# Patient Record
Sex: Male | Born: 1965 | Race: Black or African American | Hispanic: No | Smoking: Current every day smoker
Health system: Southern US, Community
[De-identification: ages and names within clinical notes are randomized; demographics above are authoritative.]

## PROBLEM LIST (undated history)

## (undated) DIAGNOSIS — K219 Gastro-esophageal reflux disease without esophagitis: Secondary | ICD-10-CM

## (undated) DIAGNOSIS — M1990 Unspecified osteoarthritis, unspecified site: Secondary | ICD-10-CM

## (undated) DIAGNOSIS — L0292 Furuncle, unspecified: Secondary | ICD-10-CM

## (undated) DIAGNOSIS — J45909 Unspecified asthma, uncomplicated: Secondary | ICD-10-CM

## (undated) DIAGNOSIS — F419 Anxiety disorder, unspecified: Secondary | ICD-10-CM

## (undated) HISTORY — PX: NO PAST SURGERIES: SHX2092

---

## 2014-05-07 ENCOUNTER — Emergency Department: Payer: Self-pay | Admitting: Internal Medicine

## 2015-01-15 ENCOUNTER — Encounter (HOSPITAL_COMMUNITY): Payer: Self-pay | Admitting: *Deleted

## 2015-01-15 ENCOUNTER — Observation Stay (HOSPITAL_COMMUNITY)
Admission: AD | Admit: 2015-01-15 | Discharge: 2015-01-17 | Disposition: A | Discharge status: Home / Self Care | Payer: No Typology Code available for payment source | Source: Intra-hospital | Attending: Psychiatry | Admitting: Psychiatry

## 2015-01-15 ENCOUNTER — Emergency Department (HOSPITAL_COMMUNITY)
Admission: EM | Admit: 2015-01-15 | Discharge: 2015-01-15 | Disposition: A | Discharge status: Other Acute Inpatient Hospital | Payer: Self-pay | Attending: Emergency Medicine | Admitting: Emergency Medicine

## 2015-01-15 ENCOUNTER — Encounter (HOSPITAL_COMMUNITY): Payer: Self-pay | Admitting: Emergency Medicine

## 2015-01-15 DIAGNOSIS — F329 Major depressive disorder, single episode, unspecified: Secondary | ICD-10-CM | POA: Insufficient documentation

## 2015-01-15 DIAGNOSIS — F32A Depression, unspecified: Secondary | ICD-10-CM | POA: Diagnosis present

## 2015-01-15 DIAGNOSIS — F1994 Other psychoactive substance use, unspecified with psychoactive substance-induced mood disorder: Secondary | ICD-10-CM | POA: Diagnosis not present

## 2015-01-15 DIAGNOSIS — Z91018 Allergy to other foods: Secondary | ICD-10-CM | POA: Insufficient documentation

## 2015-01-15 DIAGNOSIS — F1020 Alcohol dependence, uncomplicated: Secondary | ICD-10-CM | POA: Insufficient documentation

## 2015-01-15 DIAGNOSIS — F4323 Adjustment disorder with mixed anxiety and depressed mood: Principal | ICD-10-CM | POA: Insufficient documentation

## 2015-01-15 DIAGNOSIS — F322 Major depressive disorder, single episode, severe without psychotic features: Secondary | ICD-10-CM | POA: Diagnosis present

## 2015-01-15 DIAGNOSIS — R45851 Suicidal ideations: Secondary | ICD-10-CM | POA: Diagnosis not present

## 2015-01-15 DIAGNOSIS — Z91041 Radiographic dye allergy status: Secondary | ICD-10-CM | POA: Insufficient documentation

## 2015-01-15 DIAGNOSIS — Z23 Encounter for immunization: Secondary | ICD-10-CM | POA: Insufficient documentation

## 2015-01-15 DIAGNOSIS — Z590 Homelessness: Secondary | ICD-10-CM | POA: Insufficient documentation

## 2015-01-15 DIAGNOSIS — J45909 Unspecified asthma, uncomplicated: Secondary | ICD-10-CM | POA: Insufficient documentation

## 2015-01-15 HISTORY — DX: Unspecified asthma, uncomplicated: J45.909

## 2015-01-15 HISTORY — DX: Anxiety disorder, unspecified: F41.9

## 2015-01-15 LAB — CBC WITH DIFFERENTIAL/PLATELET
BASOS ABS: 0 10*3/uL (ref 0.0–0.1)
BASOS PCT: 0 %
EOS PCT: 1 %
Eosinophils Absolute: 0.1 10*3/uL (ref 0.0–0.7)
HCT: 48.7 % (ref 39.0–52.0)
Hemoglobin: 15.6 g/dL (ref 13.0–17.0)
Lymphocytes Relative: 15 %
Lymphs Abs: 1.1 10*3/uL (ref 0.7–4.0)
MCH: 28 pg (ref 26.0–34.0)
MCHC: 32 g/dL (ref 30.0–36.0)
MCV: 87.4 fL (ref 78.0–100.0)
MONO ABS: 0.7 10*3/uL (ref 0.1–1.0)
MONOS PCT: 9 %
Neutro Abs: 5.7 10*3/uL (ref 1.7–7.7)
Neutrophils Relative %: 75 %
PLATELETS: 280 10*3/uL (ref 150–400)
RBC: 5.57 MIL/uL (ref 4.22–5.81)
RDW: 13.4 % (ref 11.5–15.5)
WBC: 7.6 10*3/uL (ref 4.0–10.5)

## 2015-01-15 LAB — COMPREHENSIVE METABOLIC PANEL
ALK PHOS: 53 U/L (ref 38–126)
ALT: 18 U/L (ref 17–63)
AST: 24 U/L (ref 15–41)
Albumin: 4.1 g/dL (ref 3.5–5.0)
Anion gap: 5 (ref 5–15)
BILIRUBIN TOTAL: 0.9 mg/dL (ref 0.3–1.2)
BUN: 14 mg/dL (ref 6–20)
CALCIUM: 9.2 mg/dL (ref 8.9–10.3)
CO2: 30 mmol/L (ref 22–32)
CREATININE: 1.73 mg/dL — AB (ref 0.61–1.24)
Chloride: 107 mmol/L (ref 101–111)
GFR calc Af Amer: 52 mL/min — ABNORMAL LOW (ref >60)
GFR, EST NON AFRICAN AMERICAN: 45 mL/min — AB (ref >60)
Glucose, Bld: 99 mg/dL (ref 65–99)
Potassium: 4.8 mmol/L (ref 3.5–5.1)
Sodium: 142 mmol/L (ref 135–145)
TOTAL PROTEIN: 6.8 g/dL (ref 6.5–8.1)

## 2015-01-15 LAB — ETHANOL

## 2015-01-15 MED ORDER — HYDROXYZINE HCL 25 MG PO TABS
25.0000 mg | ORAL_TABLET | Freq: Four times a day (QID) | ORAL | Status: DC | PRN
  Administered 2015-01-15: 25 mg via ORAL
  Filled 2015-01-15: qty 1

## 2015-01-15 MED ORDER — ACETAMINOPHEN 500 MG PO TABS
500.0000 mg | ORAL_TABLET | Freq: Four times a day (QID) | ORAL | Status: DC | PRN
  Administered 2015-01-15 – 2015-01-16 (×2): 500 mg via ORAL
  Filled 2015-01-15 (×4): qty 1

## 2015-01-15 MED ORDER — ALUM & MAG HYDROXIDE-SIMETH 200-200-20 MG/5ML PO SUSP
30.0000 mL | ORAL | Status: DC | PRN
Start: 2015-01-15 — End: 2015-01-17

## 2015-01-15 MED ORDER — TRAZODONE HCL 50 MG PO TABS
50.0000 mg | ORAL_TABLET | Freq: Every evening | ORAL | Status: DC | PRN
  Administered 2015-01-15 – 2015-01-16 (×2): 50 mg via ORAL
  Filled 2015-01-15 (×2): qty 1

## 2015-01-15 MED ORDER — MAGNESIUM HYDROXIDE 400 MG/5ML PO SUSP: 30.0000 mL | Freq: Every day | ORAL | Status: DC | PRN

## 2015-01-15 NOTE — ED Notes (Signed)
Per pt, states recent separation, kicked out of house/homeless-states he drank on Friday for the first time in 4 years-states he played russian rulet-not suicidal now-wife wont let him see kids

## 2015-01-15 NOTE — BH Assessment (Addendum)
[facility] Assessment Progress Note  Per Nanine MeansJamison Lord, NP, this pt [facility] at [facility].  Thurman CoyerEric Kaplan, RN, [facility].  Pt has signed Voluntary Admission and Consent for Treatment, as well as Consent to Release Information to his sister, and signed [facility].  Pt's nurse has been notified, and agrees to send original paperwork along with pt via [facility] [place], and to call report to [phone] or [phone].  Doylene Canninghomas Johnston Maddocks, MA Triage Specialist [phone]

## 2015-01-15 NOTE — Progress Notes (Signed)
[facility] INPATIENT:  Family/Significant Other Suicide Prevention Education  Suicide Prevention Education:  Patient Refusal for Family/Significant Other Suicide Prevention Education: The patient Wayne [name] has refused to provide written consent for family/significant other to be provided Family/Significant Other Suicide Prevention Education during admission and/or prior to discharge.  Physician notified.  Wayne [name], Wayne [name] 01/15/2015, 7:03 PM

## 2015-01-15 NOTE — BH Assessment (Addendum)
Assessment Note  Wayne HawthorneJames Ellsworth is an 49 y.o. male who presents to  [facility] voluntarily for tightness in his chest and feeling nauseous. He states that he has an extensive drinking history and has been sober since 2012. He states that he drank a "fifth of scotch "Thursday and put a .44 revolver to his head and played "Guernsey[place]ian Roulette." He states it just "dawned on me what I did and I need help" and decided to come into the ED today. Patient states his recent stressors as; he is "not motivated to get to work" and "can't do it" and he is in a difficult living situation since his wife requested a separation last June. He states that he has been renting a room and "they do drugs and have people in and out and I can't go back there" but states that he has paid his rent through the middle of November and can return, but does not want to return.  He states that his wife "keeps my daughters away from me." He states that he has not been to work since December 20, 2014 but states that he still has a job and can go back to work once he is "ready." He states that he came in today hoping to "talk to somebody and maybe get some medication." he contracts for safety. Patient denies current SI. He states that he "took some pills" at age 49 "because I wanted to see my Dad" who had passed away. He states that he "just threw all the pills up" and did not require hospitalizations. Patient denies other attempts until four days ago when he was intoxicated. Patient denies self-injurious behavior at this time. Patient denies HI and history of being violent towards others. Patient denies pending charges and upcoming court dates. Patient denies AVH and does not appear to be responding to internal stimuli.   Patient is alert and oriented to person, place, time, and situation. Patient sat upright in the chair and was pleasant and cooperative. Patient made fair eye contact and appeared sad.  Patient mood and affect congruent. Patient spoke  logically and coherently.  Patient states that his sleeping habits have decreased and he has been sleeping two to three hours per night and his appetite is poor.  Patient denies previous and current outpatient treatment. Patient states that he had court mandated inpatient for substance abuse in 1994 because he had "drug charges." Patient is able to contract for safety at this time. Patient states that other than drinking four days ago he has not used any alcohol or illicit drugs.    .   Diagnosis: 309. 4 Adjustment disorder, With mixed anxiety and depressed mood   Past Medical History: History reviewed. No pertinent past medical history.  History reviewed. No pertinent past surgical history.  Family History: No family history on file.  Social History:  reports that he has never smoked. He does not have any smokeless tobacco history on file. He reports that he does not drink alcohol or use illicit drugs.  Additional Social History:  Alcohol / Drug Use Pain Medications: See PTA Prescriptions: See PTA Over the Counter: See PTA History of alcohol / drug use?: Yes Longest period of sobriety (when/how long): 4 years Substance #1 Name of Substance 1: Alcohol 1 - Age of First Use: 18 1 - Amount (size/oz): UKN 1 - Frequency: daily 1 - Duration: until four years ago 1 - Last Use / Amount: relapsed four days ago, "about a fifth"  CIWA: CIWA-Ar  BP: 156/76 mmHg Pulse Rate: 84 COWS:    Allergies: No Known Allergies  Home Medications:  (Not in a hospital admission)  OB/GYN Status:  No LMP for male patient.  General Assessment Data Location of Assessment: [facility] TTS Assessment: In system Is this a Tele or Face-to-Face Assessment?: Face-to-Face Is this an Initial Assessment or a Re-assessment for this encounter?: Initial Assessment Marital status: Separated Is patient pregnant?: No Pregnancy Status: No Living Arrangements: Other (Comment) (renting space) Can pt return to current  living arrangement?: Yes (does not want to go back) Admission Status: Voluntary Is patient capable of signing voluntary admission?: Yes Referral Source: Self/Family/Friend Insurance type: BCBS     Crisis Care Plan Living Arrangements: Other (Comment) (renting space) Name of Psychiatrist: None Name of Therapist: None  Education Status Is patient currently in school?: No Highest grade of school patient has completed: Some college  Risk to self with the past 6 months Suicidal Ideation: No Has patient been a risk to self within the past 6 months prior to admission? : No Suicidal Intent: No Has patient had any suicidal intent within the past 6 months prior to admission? : No Is patient at risk for suicide?: No Suicidal Plan?: No Has patient had any suicidal plan within the past 6 months prior to admission? : No Access to Means: No What has been your use of drugs/alcohol within the last 12 months?: Recently relapsed on alcohol Previous Attempts/Gestures: Yes How many times?: 1 (took pills at 13 vomitted) Other Self Harm Risks: Denies Triggers for Past Attempts: Other (Comment) ("wanted to see my daddy" who had passed away) Intentional Self Injurious Behavior: None Family Suicide History: Yes (Aunt when he was younger) Recent stressful life event(s): Conflict (Comment), Divorce, Other (Comment) (divorce pending) Persecutory voices/beliefs?: No Depression: Yes Depression Symptoms: Insomnia, Fatigue, Loss of interest in usual pleasures Substance abuse history and/or treatment for substance abuse?: Yes (1990s) Suicide prevention information given to non-admitted patients: Not applicable  Risk to Others within the past 6 months Homicidal Ideation: No Does patient have any lifetime risk of violence toward others beyond the six months prior to admission? : No Thoughts of Harm to Others: No Current Homicidal Intent: No Current Homicidal Plan: No Access to Homicidal Means:  No Identified Victim: Denies History of harm to others?: No Assessment of Violence: None Noted Violent Behavior Description: Denies Does patient have access to weapons?: No Criminal Charges Pending?: No Does patient have a court date: No Is patient on probation?: No  Psychosis Hallucinations: None noted Delusions: None noted  Mental Status Report Appearance/Hygiene: Unremarkable Eye Contact: Fair Motor Activity: Freedom of movement Speech: Logical/coherent Level of Consciousness: Alert Mood: Sad Affect: Sad Anxiety Level: None Thought Processes: Coherent, Relevant Judgement: Unimpaired Orientation: Person, Place, Time, Situation, Appropriate for developmental age Obsessive Compulsive Thoughts/Behaviors: None  Cognitive Functioning Concentration: Decreased Memory: Recent Intact, Remote Intact IQ: Average Insight: Fair Impulse Control: Fair Appetite: Poor Sleep: Decreased Total Hours of Sleep: 3  ADLScreening [facility] Assessment Services) Patient's cognitive ability adequate to safely complete daily activities?: Yes Patient able to express need for assistance with ADLs?: Yes Independently performs ADLs?: Yes (appropriate for developmental age)  Prior Inpatient Therapy Prior Inpatient Therapy: Yes Prior Therapy Dates: 1994 Prior Therapy Facilty/Provider(s): in [place] Reason for Treatment: Substance Abuse  Prior Outpatient Therapy Prior Outpatient Therapy: No Prior Therapy Dates: N/A Prior Therapy Facilty/Provider(s): N/A Reason for Treatment: N/A Does patient have an ACCT team?: No Does patient have Intensive In-House Services?  :  No Does patient have [facility] services? : No Does patient have P4CC services?: No  ADL Screening (condition at time of admission) Patient's cognitive ability adequate to safely complete daily activities?: Yes Is the patient deaf or have difficulty hearing?: No Does the patient have difficulty seeing, even when wearing glasses/contacts?:  No Does the patient have difficulty concentrating, remembering, or making decisions?: No Patient able to express need for assistance with ADLs?: Yes Does the patient have difficulty dressing or bathing?: No Independently performs ADLs?: Yes (appropriate for developmental age) Does the patient have difficulty walking or climbing stairs?: No Weakness of Legs: None Weakness of Arms/Hands: None  Home Assistive Devices/Equipment Home Assistive Devices/Equipment: None  Therapy Consults (therapy consults require a physician order) PT Evaluation Needed: No OT Evalulation Needed: No SLP Evaluation Needed: No Abuse/Neglect Assessment (Assessment to be complete while patient is alone) Physical Abuse: Denies Verbal Abuse: Denies Sexual Abuse: Denies Exploitation of patient/patient's resources: Denies Self-Neglect: Denies Values / Beliefs Cultural Requests During Hospitalization: None Spiritual Requests During Hospitalization: None Consults Spiritual Care Consult Needed: No Social Work Consult Needed: No Merchant navy officer (For Healthcare) Does patient have an advance directive?: No Would patient like information on creating an advanced directive?: No - patient declined information    Additional Information 1:1 In Past 12 Months?: No CIRT Risk: No Elopement Risk: No Does patient have medical clearance?: No     Disposition:  Disposition Initial Assessment Completed for this Encounter: Yes Disposition of Patient: Other dispositions Other disposition(s): Other (Comment) (observation unit)  On Site Evaluation by:   Reviewed with Physician:    Lamere Lightner 01/15/2015 1:23 PM

## 2015-01-15 NOTE — ED Provider Notes (Signed)
CSN: [id number]     Arrival date & time 01/15/15  1146 History   First MD Initiated Contact with Patient 01/15/15 1258     Chief Complaint  Patient presents with  . Depression     (Consider location/radiation/quality/duration/timing/severity/associated sxs/prior Treatment) HPI Comments: Pt here with increased depression for several days caused by recent divorce--pt states that he is homeless and has suicidal ideations and played 'russian roulette' with a gun a few days ago-- Denies any prior h/o of si/hi Denies prior h/o suicide attempt or psychaitric hx- Did recently start to use etoh but denies illicit drug use  No current si but notes severe depression  Patient is a 49 y.o. male presenting with depression. The history is provided by the patient.  Depression    History reviewed. No pertinent past medical history. History reviewed. No pertinent past surgical history. No family history on file. Social History  Substance Use Topics  . Smoking status: Never Smoker   . Smokeless tobacco: None  . Alcohol Use: No    Review of Systems  Psychiatric/Behavioral: Positive for depression.  All other systems reviewed and are negative.     Allergies  Review of patient's allergies indicates no known allergies.  Home Medications   Prior to Admission medications   Medication Sig Start Date End Date Taking? Authorizing Provider  acetaminophen (TYLENOL) 500 MG tablet Take 500 mg by mouth every 6 (six) hours as needed for mild pain, moderate pain, fever or headache.   Yes Historical Provider, MD  influenza vac recombinant HA trivalent (FLUBLOK) injection Inject 0.5 mLs into the muscle once.   Yes Historical Provider, MD  pneumococcal 23 valent vaccine (PNU-IMMUNE) 25 MCG/0.5ML injection Inject 0.5 mLs into the muscle once.   Yes Historical Provider, MD  Tdap (BOOSTRIX) 5-2.5-18.5 LF-MCG/0.5 injection Inject 0.5 mLs into the muscle once.   Yes Historical Provider, MD   BP 156/76 mmHg   Pulse 84  Temp(Src) 98.1 F (36.7 C) (Oral)  Resp 24  SpO2 99% Physical Exam  Constitutional: He is oriented to person, place, and time. He appears well-developed and well-nourished.  Non-toxic appearance. No distress.  HENT:  Head: Normocephalic and atraumatic.  Eyes: Conjunctivae, EOM and lids are normal. Pupils are equal, round, and reactive to light.  Neck: Normal range of motion. Neck supple. No tracheal deviation present. No thyroid mass present.  Cardiovascular: Normal rate, regular rhythm and normal heart sounds.  Exam reveals no gallop.   No murmur heard. Pulmonary/Chest: Effort normal and breath sounds normal. No stridor. No respiratory distress. He has no decreased breath sounds. He has no wheezes. He has no rhonchi. He has no rales.  Abdominal: Soft. Normal appearance and bowel sounds are normal. He exhibits no distension. There is no tenderness. There is no rebound and no CVA tenderness.  Musculoskeletal: Normal range of motion. He exhibits no edema or tenderness.  Neurological: He is alert and oriented to person, place, and time. He has normal strength. No cranial nerve deficit or sensory deficit. GCS eye subscore is 4. GCS verbal subscore is 5. GCS motor subscore is 6.  Skin: Skin is warm and dry. No abrasion and no rash noted.  Psychiatric: His affect is blunt. His speech is delayed. He is withdrawn. He expresses no suicidal ideation. He expresses no suicidal plans.  Nursing note and vitals reviewed.   ED Course  Procedures (including critical care time) Labs Review Labs Reviewed  COMPREHENSIVE METABOLIC PANEL  ETHANOL  CBC WITH DIFFERENTIAL/PLATELET  URINE RAPID  DRUG SCREEN, HOSP PERFORMED    Imaging Review No results found. I have personally reviewed and evaluated these images and lab results as part of my medical decision-making.   EKG Interpretation None      MDM   Final diagnoses:  None    Pt has been seen by [facility] and accepted for obs, labs pending  and dr. Verdie Mosherliu to f/u    Lorre NickAnthony Taziyah Iannuzzi, MD 01/15/15 1317

## 2015-01-15 NOTE — H&P (Signed)
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 Depression [F32.9] 01/15/2015  . MDD (major depressive disorder), single episode, severe ([place]) [F32.2] 01/15/2015   History of Present Illness: Wayne [name] presents from the [facility] ED due to initially presenting with CP and nausea, but later endorsed going on a drinking binge while having increasing suicidal ideations and reported suicidal attempt by putting a 44 revolver to his head. The patient is endorsing multiple triggers to include recent separation from his wife, inability to see his kids and drinking alcohol again after being C/D and sober x 4 years. Wayne [name] is denying any suicidal thoughts, homicidal ideations or self harm at this present time. Patient is endorsing ruminating on issues, racing thoughts and worrying. He is endorsing a hx of GAD and panic attacks, last episode a week ago. He is denying any AVH, paranoia or delusional thoughts.  Associated Signs/Symptoms: Depression Symptoms:  depressed mood, feelings of worthlessness/guilt, hopelessness, suicidal thoughts with specific plan, suicidal attempt, (Hypo) Manic Symptoms:  non applicable Anxiety Symptoms:  Panic Symptoms, Psychotic Symptoms:  not applicable PTSD Symptoms: NA Total Time spent with patient: 30 minutes  Past Psychiatric History: Alcoholism, Substance abuse  Risk to Self:  no Risk to Others:  no Prior Inpatient Therapy:  no Prior Outpatient Therapy:  while incarcerated  Alcohol Screening:   Substance Abuse History in the last 12 months:  Yes.   Consequences of Substance Abuse: Family Consequences:  estranged, seperated from wife Previous Psychotropic Medications:  no Psychological Evaluations: No  Past Medical History:  Past Medical History  Diagnosis Date  . Asthma   . Anxiety    History reviewed. No pertinent past surgical history. Family History: History reviewed. No pertinent family history. Family Psychiatric  History: denies Social History:  History  Alcohol Use No     History  Drug Use No    Social History   Social History  . Marital Status: Married    Spouse Name: N/A  . Number of Children: N/A  . Years of Education: N/A   Social History Main Topics  . Smoking status: Never Smoker   . Smokeless tobacco: Never Used  . Alcohol Use: No  . Drug Use: No  . Sexual Activity: Yes   Other Topics Concern  . None   Social History Narrative   Additional Social History:    Pain Medications: See PTA Prescriptions: See PTA Over the Counter: See PTA History of alcohol / drug use?: Yes (prior to last week, last time drank was 2012) Longest period of sobriety (when/how long): 4 years Name of Substance 1: Alcohol 1 - Age of First Use: 18 1 - Amount (size/oz): UKN 1 - Frequency: daily 1 - Duration: until four years ago 1 - Last Use / Amount: relapsed four years ago, "about a fifth"                  Allergies:   Allergies  Allergen Reactions  . Iodine Anaphylaxis    Swelling and rash  . Coconut Flavor Rash    rash   Lab Results:  Results for orders placed or performed during the hospital encounter of 01/15/15 (from the past 48 hour(s))  Comprehensive metabolic panel     Status: Abnormal   Collection  Time: 01/15/15  1:17 PM  Result Value Ref Range   Sodium 142 135 - 145 mmol/L   Potassium 4.8 3.5 - 5.1 mmol/L   Chloride 107 101 - 111 mmol/L   CO2 30 22 - 32 mmol/L   Glucose, Bld 99 65 - 99 mg/dL   BUN 14 6 - 20 mg/dL   Creatinine, Ser 1.73 (H) 0.61 - 1.24 mg/dL   Calcium 9.2 8.9 - 10.3 mg/dL   Total Protein 6.8 6.5 - 8.1 g/dL   Albumin 4.1 3.5 - 5.0 g/dL   AST 24 15 - 41 U/L   ALT 18 17 - 63 U/L   Alkaline  Phosphatase 53 38 - 126 U/L   Total Bilirubin 0.9 0.3 - 1.2 mg/dL   GFR calc non Af Amer 45 (L) >60 mL/min   GFR calc Af Amer 52 (L) >60 mL/min    Comment: (NOTE) The eGFR has been calculated using the CKD EPI equation. This calculation has not been validated in all clinical situations. eGFR's persistently <60 mL/min signify possible Chronic Kidney Disease.    Anion gap 5 5 - 15  Ethanol     Status: None   Collection Time: 01/15/15  1:17 PM  Result Value Ref Range   Alcohol, Ethyl (B) <5 <5 mg/dL    Comment:        LOWEST DETECTABLE LIMIT FOR SERUM ALCOHOL IS 5 mg/dL FOR MEDICAL PURPOSES ONLY   CBC with Diff     Status: None   Collection Time: 01/15/15  1:17 PM  Result Value Ref Range   WBC 7.6 4.0 - 10.5 K/uL   RBC 5.57 4.22 - 5.81 MIL/uL   Hemoglobin 15.6 13.0 - 17.0 g/dL   HCT 48.7 39.0 - 52.0 %   MCV 87.4 78.0 - 100.0 fL   MCH 28.0 26.0 - 34.0 pg   MCHC 32.0 30.0 - 36.0 g/dL   RDW 13.4 11.5 - 15.5 %   Platelets 280 150 - 400 K/uL   Neutrophils Relative % 75 %   Neutro Abs 5.7 1.7 - 7.7 K/uL   Lymphocytes Relative 15 %   Lymphs Abs 1.1 0.7 - 4.0 K/uL   Monocytes Relative 9 %   Monocytes Absolute 0.7 0.1 - 1.0 K/uL   Eosinophils Relative 1 %   Eosinophils Absolute 0.1 0.0 - 0.7 K/uL   Basophils Relative 0 %   Basophils Absolute 0.0 0.0 - 0.1 K/uL    Metabolic Disorder Labs:  No results found for: HGBA1C, MPG No results found for: PROLACTIN No results found for: CHOL, TRIG, HDL, CHOLHDL, VLDL, LDLCALC  Current Medications: Current Facility-Administered Medications  Medication Dose Route Frequency Provider Last Rate Last Dose  . acetaminophen (TYLENOL) tablet 500 mg  500 mg Oral Q6H PRN Laverle Hobby, PA-C      . alum & mag hydroxide-simeth (MAALOX/MYLANTA) 200-200-20 MG/5ML suspension 30 mL  30 mL Oral Q4H PRN Laverle Hobby, PA-C      . hydrOXYzine (ATARAX/VISTARIL) tablet 25 mg  25 mg Oral Q6H PRN Laverle Hobby, PA-C      . magnesium hydroxide (MILK OF  MAGNESIA) suspension 30 mL  30 mL Oral Daily PRN Laverle Hobby, PA-C      . traZODone (DESYREL) tablet 50 mg  50 mg Oral QHS,Wayne X 1 Khadeejah Castner E Choya Tornow, PA-C       PTA Medications: Prescriptions prior to admission  Medication Sig Dispense Refill Last Dose  . acetaminophen (TYLENOL) 500 MG tablet Take  500 mg by mouth every 6 (six) hours as needed for mild pain, moderate pain, fever or headache.   01/14/2015 at Unknown time  . influenza vac recombinant HA trivalent (FLUBLOK) injection Inject 0.5 mLs into the muscle once.   11/23/2014  . pneumococcal 23 valent vaccine (PNU-IMMUNE) 25 MCG/0.5ML injection Inject 0.5 mLs into the muscle once.   11/23/2014  . Tdap (BOOSTRIX) 5-2.5-18.5 LF-MCG/0.5 injection Inject 0.5 mLs into the muscle once.   11/23/2014    Musculoskeletal: Strength & Muscle Tone: within normal limits Gait & Station: normal Patient leans: N/A  Psychiatric Specialty Exam: Physical Exam  Nursing note and vitals reviewed. Constitutional: He appears well-nourished. No distress.  HENT:  Head: Normocephalic.  Eyes: Pupils are equal, round, and reactive to light.  Skin: Skin is warm and dry. He is not diaphoretic.    Review of Systems  Constitutional: Positive for fever, chills and diaphoresis.  Eyes: Negative.   Cardiovascular: Positive for chest pain.  Gastrointestinal: Negative.   Genitourinary: Negative.   Neurological: Positive for headaches.  Endo/Heme/Allergies: Negative.   Psychiatric/Behavioral: Positive for depression, suicidal ideas and substance abuse. Negative for hallucinations. The patient is nervous/anxious.     Blood pressure 102/63, pulse 109, temperature 98.5 F (36.9 C), temperature source Oral, resp. rate 18, height 6' (1.829 m), weight 92.534 kg (204 lb).Body mass index is 27.66 kg/(m^2).  General Appearance: Disheveled  Eye Contact::  Good  Speech:  Clear and Coherent  Volume:  Normal  Mood:  Depressed and Hopeless  Affect:  Appropriate  Thought  Process:  Circumstantial  Orientation:  Full (Time, Place, and Person)  Thought Content:  Negative  Suicidal Thoughts:  Yes.  with intent/plan  Homicidal Thoughts:  No  Memory:  Immediate;   Good  Judgement:  Impaired  Insight:  Lacking  Psychomotor Activity:  Negative  Concentration:  Fair  Recall:  Morgan of Knowledge:Good  Language: Negative  Akathisia:  Negative  Handed:  Right  AIMS (if indicated):     Assets:  Desire for Improvement  ADL's:  Intact  Cognition: WNL  Sleep:        Treatment Plan Summary: Patient admitted to observation Unit for crises intervention,safety and stabilization. TTS to evaluate in the am  Observation Level/Precautions:  Continuous Observation  Laboratory:    Psychotherapy:    Medications:    Consultations:    Discharge Concerns:    Estimated LOS: 24 - 48 hours  Other:     I certify that inpatient services furnished can reasonably be expected to improve the patient's condition.   Federico Maiorino E 11/7/20169:33 PM

## 2015-01-15 NOTE — Tx Team (Signed)
Initial Interdisciplinary Treatment Plan   PATIENT STRESSORS: Financial difficulties Marital or [name] conflict   PATIENT STRENGTHS: Ability for insight Active sense of humor Average or above average intelligence Capable of independent living Communication skills General fund of knowledge Motivation for treatment/growth   PROBLEM LIST: Problem List/Patient Goals Date to be addressed Date deferred Reason deferred Estimated date of resolution  si thoughts 01/15/15     depression 01/15/15                                                DISCHARGE CRITERIA:  Improved stabilization in mood, thinking, and/or behavior Need for constant or close observation no longer present Verbal commitment to aftercare and medication compliance  PRELIMINARY DISCHARGE PLAN: Outpatient therapy Return to previous living arrangement Return to previous work or school arrangements  PATIENT/FAMIILY INVOLVEMENT: This treatment plan has been presented to and reviewed with Wayne patient, Wayne HawthorneJames [name], and/or [name], Wayne [name] have been given Wayne opportunity to ask questions and make suggestions.  Wayne [name], Wayne [name] Wayne [name] 01/15/2015, 8:17 PM
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
## 2015-01-15 NOTE — Progress Notes (Signed)
[facility] ED CM noted pt with coverage but no pcp listed Spoke with pt who confirms no pcp but  [facility] ED CM spoke with pt on how to obtain an in network pcp with insurance coverage via the customer service number or web site  Cm reviewed ED level of care for crisis/emergent services and community pcp level of care to manage continuous or chronic medical concerns.  The pt voiced understanding CM encouraged pt and discussed pt's responsibility to verify with pt's insurance carrier that any recommended medical provider offered by any emergency room or a hospital provider is within the carrier's network. The pt voiced understanding

## 2015-01-15 NOTE — BH Assessment (Signed)
Assessment completed. Consulted with Nanine MeansJamison Lord, DNP who recommends treatment in the observation unit at this time.  Davina PokeJoVea Amadeus Oyama, LCSW Therapeutic Triage Specialist  Health 01/15/2015 12:46 PM

## 2015-01-16 DIAGNOSIS — F322 Major depressive disorder, single episode, severe without psychotic features: Secondary | ICD-10-CM | POA: Diagnosis not present

## 2015-01-16 MED ORDER — MIRTAZAPINE 15 MG PO TABS
15.0000 mg | ORAL_TABLET | Freq: Every day | ORAL | Status: DC
  Administered 2015-01-16: 15 mg via ORAL
  Filled 2015-01-16: qty 1

## 2015-01-16 NOTE — BH Assessment (Signed)
[facility] Assessment Progress Note Patient was re-assessed this date by Earlene Plateravis NP and Crist FatUmberger LCAS with patient denying any S/I but was very tearful at the time of the assessment stating he was still very depressed. Patient stated he has never tried any medications to assist with his depression and was open to medication interventions. Patient will be kept in the Observational Unit for another 24 hours while starting patient on medication/s with behaviors being monitored.

## 2015-01-16 NOTE — Progress Notes (Signed)
D: Pt A & O X 4. Pt denied SI, HI, AVH and pain when assessed. Vitals done, WNL and no physical distress to note at this time. Pt did performed his ADLs earlier this shift. A: Writer informed and educated pt of new order for Remeron 15 mg PO to start tonight. Provided support, encouragement and availability throughout this shift. Q 15 minutes checks maintained for safety without outburst or self injurious behavior to note thus far.  R: Pt reported feeling depressed when assessed. Presents with congruent affect and depressed mood. Verbalized understanding related to new medication. Will continue to monitor pt for safety and mood stabilization.

## 2015-01-16 NOTE — Progress Notes (Signed)
Patient ID: Wayne HawthorneJames Hegg, male   DOB: [birth year], 49 y.o.   MRN: [id number] pt appears to be sleeping well, changing positions as needed throughout the night. No distress noted. C/o backache at bedtime, tylenol given with relief. Denies si/hi/pain. Safety maintained.
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
## 2015-01-16 NOTE — Progress Notes (Signed)
Patient in bed resting at the beginning of this shift. Mood and affects sad and depressed. Although he denied SI/HI and denied Hallucinations. Writer encouraged and supported patient. Q 15 minute check continues as ordered for safety.

## 2015-01-17 MED ORDER — HYDROXYZINE HCL 25 MG PO TABS: 25.0000 mg | ORAL_TABLET | Freq: Four times a day (QID) | ORAL | Status: DC | PRN

## 2015-01-17 MED ORDER — MIRTAZAPINE 15 MG PO TABS: 15.0000 mg | ORAL_TABLET | Freq: Every day | ORAL | Status: DC
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
## 2015-01-17 NOTE — Progress Notes (Signed)
Nursing Discharge Note:  Patient's belongings returned and signed for, discharge instructions and prescriptions given to patient, patient signing discharge instruction packet and also receiving a copy of same discharge instruction packet. Follow up plan for patient also on packet and patient states understanding of what he is to do concerning followup. [facility].

## 2015-01-17 NOTE — Progress Notes (Signed)
Nursing Shift Assessment:  Patient sleeping, easily awakens, mood and affect appropriate for situation. Patient denies any SI/HI/AVH and verbally contracts for safety. Patient rates depression at "a four or five" which he states is "much better than when I came in". Patient asking when he can be discharged. Q15 minute checks for safety continuous, therapeutic support provided to patient by staff. Patient remains safe on Unit.

## 2016-04-14 ENCOUNTER — Other Ambulatory Visit: Payer: Self-pay | Admitting: Student

## 2016-04-14 DIAGNOSIS — R202 Paresthesia of skin: Secondary | ICD-10-CM

## 2016-04-14 DIAGNOSIS — R200 Anesthesia of skin: Secondary | ICD-10-CM

## 2016-04-14 DIAGNOSIS — M5412 Radiculopathy, cervical region: Secondary | ICD-10-CM

## 2016-04-24 ENCOUNTER — Ambulatory Visit: Payer: Managed Care, Other (non HMO)

## 2016-07-25 ENCOUNTER — Other Ambulatory Visit: Payer: Self-pay | Admitting: Student

## 2016-07-25 DIAGNOSIS — M7521 Bicipital tendinitis, right shoulder: Secondary | ICD-10-CM

## 2016-07-25 DIAGNOSIS — M7581 Other shoulder lesions, right shoulder: Secondary | ICD-10-CM

## 2016-08-06 ENCOUNTER — Ambulatory Visit
Admission: RE | Admit: 2016-08-06 | Discharge: 2016-08-06 | Disposition: A | Discharge status: Home / Self Care | Payer: Managed Care, Other (non HMO) | Source: Ambulatory Visit | Attending: Student | Admitting: Student

## 2016-08-06 DIAGNOSIS — M7581 Other shoulder lesions, right shoulder: Secondary | ICD-10-CM

## 2016-08-06 DIAGNOSIS — M5412 Radiculopathy, cervical region: Secondary | ICD-10-CM | POA: Insufficient documentation

## 2016-08-06 DIAGNOSIS — M4802 Spinal stenosis, cervical region: Secondary | ICD-10-CM | POA: Insufficient documentation

## 2016-08-06 DIAGNOSIS — M7521 Bicipital tendinitis, right shoulder: Secondary | ICD-10-CM

## 2016-08-06 DIAGNOSIS — M50221 Other cervical disc displacement at C4-C5 level: Secondary | ICD-10-CM | POA: Diagnosis not present

## 2016-08-06 DIAGNOSIS — M50222 Other cervical disc displacement at C5-C6 level: Secondary | ICD-10-CM | POA: Insufficient documentation

## 2016-09-11 ENCOUNTER — Encounter
Admission: RE | Admit: 2016-09-11 | Discharge: 2016-09-11 | Disposition: A | Discharge status: Home / Self Care | Payer: Managed Care, Other (non HMO) | Source: Ambulatory Visit | Attending: Surgery | Admitting: Surgery

## 2016-09-11 HISTORY — DX: Unspecified osteoarthritis, unspecified site: M19.90

## 2016-09-11 HISTORY — DX: Furuncle, unspecified: L02.92

## 2016-09-11 HISTORY — DX: Gastro-esophageal reflux disease without esophagitis: K21.9

## 2016-09-11 NOTE — Patient Instructions (Signed)
  Your procedure is scheduled on: 09-18-16 Report to Same Day Surgery 2nd floor medical mall [facility](Medical Mall Entrance-take elevator on left to 2nd floor.  Check in with surgery information desk.) To find out your arrival time please call [phone] between 1PM - 3PM on 09-17-16  Remember: Instructions that are not followed completely may result in serious medical risk, up to and including death, or upon the discretion of your surgeon and anesthesiologist your surgery may need to be rescheduled.    _x___ 1. Do not eat food or drink liquids after midnight. No gum chewing or hard candies.     __x__ 2. No Alcohol for 24 hours before or after surgery.   __x__3. No Smoking for 24 prior to surgery.   ____  4. Bring all medications with you on the day of surgery if instructed.    __x__ 5. Notify your doctor if there is any change in your medical condition     (cold, fever, infections).     Do not wear jewelry, make-up, hairpins, clips or nail polish.  Do not wear lotions, powders, or perfumes. You may wear deodorant.  Do not shave 48 hours prior to surgery. Men may shave face and neck.  Do not bring valuables to the hospital.    [facility] is not responsible for any belongings or valuables.               Contacts, dentures or bridgework may not be worn into surgery.  Leave your suitcase in the car. After surgery it may be brought to your room.  For patients admitted to the hospital, discharge time is determined by your treatment team.   Patients discharged the day of surgery will not be allowed to drive home.  You will need someone to drive you home and stay with you the night of your procedure.    Please read over the following fact sheets that you were given:     ____ Take anti-hypertensive (unless it includes a diuretic), cardiac, seizure, asthma,     anti-reflux and psychiatric medicines. These include:  1. NONE  2.  3.  4.  5.  6.  ____Fleets enema or Magnesium Citrate as  directed.   ____ Use CHG Soap or sage wipes as directed on instruction sheet   ____ Use inhalers on the day of surgery and bring to hospital day of surgery  ____ Stop Metformin and Janumet 2 days prior to surgery.    ____ Take 1/2 of usual insulin dose the night before surgery and none on the morning     surgery.   ____ Follow recommendations from Cardiologist, Pulmonologist or PCP regarding stopping Aspirin, Coumadin, Pllavix ,Eliquis, Effient, or Pradaxa, and Pletal.  X____Stop Anti-inflammatories such as Advil, Aleve, IBUPROFEN, Motrin, Naproxen, Naprosyn, Goodies powders or aspirin products NOW-OK to take Tylenol    ____ Stop supplements until after surgery.     ____ Bring C-Pap to the hospital.

## 2016-09-12 ENCOUNTER — Other Ambulatory Visit: Payer: Managed Care, Other (non HMO)

## 2016-09-18 ENCOUNTER — Ambulatory Visit: Admission: RE | Admit: 2016-09-18 | Payer: Managed Care, Other (non HMO) | Source: Ambulatory Visit | Admitting: Surgery

## 2016-10-14 ENCOUNTER — Encounter
Admission: RE | Admit: 2016-10-14 | Discharge: 2016-10-14 | Disposition: A | Discharge status: Home / Self Care | Payer: Managed Care, Other (non HMO) | Source: Ambulatory Visit | Attending: Surgery | Admitting: Surgery

## 2016-10-14 NOTE — Patient Instructions (Signed)
  Your procedure is scheduled on: 10-21-16 Report to Same Day Surgery 2nd floor medical mall [facility](Medical Mall Entrance-take elevator on left to 2nd floor.  Check in with surgery information desk.) To find out your arrival time please call [phone] between 1PM - 3PM on 10-20-16  Remember: Instructions that are not followed completely may result in serious medical risk, up to and including death, or upon the discretion of your surgeon and anesthesiologist your surgery may need to be rescheduled.    _x___ 1. Do not eat food or drink liquids after midnight. No gum chewing or  hard candies.     __x__ 2. No Alcohol for 24 hours before or after surgery.   __x__3. No Smoking for 24 prior to surgery.   ____  4. Bring all medications with you on the day of surgery if instructed.    __x__ 5. Notify your doctor if there is any change in your medical condition     (cold, fever, infections).     Do not wear jewelry, make-up, hairpins, clips or nail polish.  Do not wear lotions, powders, or perfumes. You may wear deodorant.  Do not shave 48 hours prior to surgery. Men may shave face and neck.  Do not bring valuables to the hospital.    [facility] for any belongings or valuables.               Contacts, dentures or bridgework may not be worn into surgery.  Leave your suitcase in the car. After surgery it may be brought to your room.  For patients admitted to the hospital, discharge time is determined by your   treatment team.   Patients discharged the day of surgery will not be allowed to drive home.  You will need someone to drive you home and stay with you the night of your procedure.    Please read over the following fact sheets that you were given:   [facility] Pre[facility] and or MRSA Information   ____ Take anti-hypertensive (unless it includes a diuretic), cardiac, seizure, asthma,     anti-reflux and psychiatric medicines. These include:  1.  NONE  2.  3.  4.  5.  6.  ____Fleets enema or Magnesium Citrate as directed.   ____ Use CHG Soap or sage wipes as directed on instruction sheet   ____ Use inhalers on the day of surgery and bring to hospital day of surgery  ____ Stop Metformin and Janumet 2 days prior to surgery.    ____ Take 1/2 of usual insulin dose the night before surgery and none on the morning     surgery.   ____ Follow recommendations from Cardiologist, Pulmonologist or PCP regarding          stopping Aspirin, Coumadin, Pllavix ,Eliquis, Effient, or Pradaxa, and Pletal.  X____Stop Anti-inflammatories such as Advil, Aleve, IBUPROFEN, Motrin, Naproxen, Naprosyn, Goodies powders or aspirin products NOW-OK to take Tylenol    ____ Stop supplements until after surgery.     ____ Bring C-Pap to the hospital.

## 2016-10-20 MED ORDER — CEFAZOLIN SODIUM-DEXTROSE 2-4 GM/100ML-% IV SOLN
2.0000 g | Freq: Once | INTRAVENOUS | Status: AC
Start: 2016-10-20 — End: 2016-10-21
  Administered 2016-10-21: 2 g via INTRAVENOUS

## 2016-10-21 ENCOUNTER — Encounter: Admission: RE | Payer: Self-pay | Source: Ambulatory Visit

## 2016-10-21 ENCOUNTER — Ambulatory Visit: Payer: Managed Care, Other (non HMO) | Admitting: Registered Nurse

## 2016-10-21 ENCOUNTER — Ambulatory Visit
Admission: RE | Admit: 2016-10-21 | Discharge: 2016-10-21 | Disposition: A | Discharge status: Home / Self Care | Payer: Managed Care, Other (non HMO) | Source: Ambulatory Visit | Attending: Surgery | Admitting: Surgery

## 2016-10-21 ENCOUNTER — Encounter
Admission: RE | Disposition: A | Discharge status: Home / Self Care | Payer: Self-pay | Source: Ambulatory Visit | Attending: Surgery

## 2016-10-21 ENCOUNTER — Encounter: Payer: Self-pay | Admitting: *Deleted

## 2016-10-21 DIAGNOSIS — Z87892 Personal history of anaphylaxis: Secondary | ICD-10-CM | POA: Diagnosis not present

## 2016-10-21 DIAGNOSIS — Z791 Long term (current) use of non-steroidal anti-inflammatories (NSAID): Secondary | ICD-10-CM | POA: Diagnosis not present

## 2016-10-21 DIAGNOSIS — M7541 Impingement syndrome of right shoulder: Secondary | ICD-10-CM | POA: Diagnosis not present

## 2016-10-21 DIAGNOSIS — M7521 Bicipital tendinitis, right shoulder: Secondary | ICD-10-CM | POA: Insufficient documentation

## 2016-10-21 DIAGNOSIS — M65811 Other synovitis and tenosynovitis, right shoulder: Secondary | ICD-10-CM | POA: Insufficient documentation

## 2016-10-21 DIAGNOSIS — F17210 Nicotine dependence, cigarettes, uncomplicated: Secondary | ICD-10-CM | POA: Insufficient documentation

## 2016-10-21 DIAGNOSIS — Z8709 Personal history of other diseases of the respiratory system: Secondary | ICD-10-CM | POA: Insufficient documentation

## 2016-10-21 DIAGNOSIS — Z888 Allergy status to other drugs, medicaments and biological substances status: Secondary | ICD-10-CM | POA: Insufficient documentation

## 2016-10-21 DIAGNOSIS — M19011 Primary osteoarthritis, right shoulder: Secondary | ICD-10-CM | POA: Insufficient documentation

## 2016-10-21 DIAGNOSIS — K219 Gastro-esophageal reflux disease without esophagitis: Secondary | ICD-10-CM | POA: Diagnosis not present

## 2016-10-21 DIAGNOSIS — Z91013 Allergy to seafood: Secondary | ICD-10-CM | POA: Insufficient documentation

## 2016-10-21 DIAGNOSIS — M75121 Complete rotator cuff tear or rupture of right shoulder, not specified as traumatic: Secondary | ICD-10-CM | POA: Diagnosis present

## 2016-10-21 DIAGNOSIS — Z91018 Allergy to other foods: Secondary | ICD-10-CM | POA: Diagnosis not present

## 2016-10-21 DIAGNOSIS — M19041 Primary osteoarthritis, right hand: Secondary | ICD-10-CM | POA: Diagnosis not present

## 2016-10-21 HISTORY — PX: SHOULDER ARTHROSCOPY WITH SUBACROMIAL DECOMPRESSION: SHX5684

## 2016-10-21 HISTORY — PX: SHOULDER ARTHROSCOPY WITH DEBRIDEMENT AND BICEP TENDON REPAIR: SHX5690

## 2016-10-21 HISTORY — PX: SHOULDER ARTHROSCOPY WITH OPEN ROTATOR CUFF REPAIR: SHX6092

## 2016-10-21 SURGERY — ARTHROSCOPY, SHOULDER WITH REPAIR, ROTATOR CUFF, OPEN
Anesthesia: General | Site: Shoulder | Laterality: Right | Wound class: Clean

## 2016-10-21 SURGERY — ARTHROSCOPY, SHOULDER WITH REPAIR, ROTATOR CUFF, OPEN
Anesthesia: Choice | Laterality: Right

## 2016-10-21 MED ORDER — ROPIVACAINE HCL 5 MG/ML IJ SOLN
INTRAMUSCULAR | Status: DC | PRN
  Administered 2016-10-21: 30 mL via PERINEURAL

## 2016-10-21 MED ORDER — FENTANYL CITRATE (PF) 100 MCG/2ML IJ SOLN
INTRAMUSCULAR | Status: DC | PRN
  Administered 2016-10-21 (×2): 50 ug via INTRAVENOUS

## 2016-10-21 MED ORDER — ACETAMINOPHEN 10 MG/ML IV SOLN
INTRAVENOUS | Status: DC | PRN
  Administered 2016-10-21: 1000 mg via INTRAVENOUS

## 2016-10-21 MED ORDER — PROPOFOL 10 MG/ML IV BOLUS
INTRAVENOUS | Status: AC
  Filled 2016-10-21: qty 20

## 2016-10-21 MED ORDER — SUGAMMADEX SODIUM 200 MG/2ML IV SOLN
INTRAVENOUS | Status: AC
  Filled 2016-10-21: qty 2

## 2016-10-21 MED ORDER — DEXAMETHASONE SODIUM PHOSPHATE 10 MG/ML IJ SOLN
INTRAMUSCULAR | Status: DC | PRN
  Administered 2016-10-21: 5 mg via INTRAVENOUS

## 2016-10-21 MED ORDER — PHENYLEPHRINE HCL 10 MG/ML IJ SOLN
INTRAMUSCULAR | Status: DC | PRN
  Administered 2016-10-21: 50 ug via INTRAVENOUS

## 2016-10-21 MED ORDER — ONDANSETRON HCL 4 MG/2ML IJ SOLN
INTRAMUSCULAR | Status: AC
  Filled 2016-10-21: qty 2

## 2016-10-21 MED ORDER — METOCLOPRAMIDE HCL 5 MG/ML IJ SOLN: 5.0000 mg | Freq: Three times a day (TID) | INTRAMUSCULAR | Status: DC | PRN

## 2016-10-21 MED ORDER — OXYCODONE HCL 5 MG PO TABS: 5.0000 mg | ORAL_TABLET | ORAL | Status: DC | PRN

## 2016-10-21 MED ORDER — LIDOCAINE HCL (PF) 1 % IJ SOLN
INTRAMUSCULAR | Status: AC
  Filled 2016-10-21: qty 5

## 2016-10-21 MED ORDER — MIDAZOLAM HCL 2 MG/2ML IJ SOLN
INTRAMUSCULAR | Status: AC
  Administered 2016-10-21: 1 mg
  Filled 2016-10-21: qty 2

## 2016-10-21 MED ORDER — SODIUM CHLORIDE 0.9 % IV SOLN
INTRAVENOUS | Status: DC | PRN
  Administered 2016-10-21: 20 ug/min via INTRAVENOUS

## 2016-10-21 MED ORDER — MIDAZOLAM HCL 2 MG/2ML IJ SOLN: 1.0000 mg | Freq: Once | INTRAMUSCULAR | Status: DC

## 2016-10-21 MED ORDER — ROPIVACAINE HCL 5 MG/ML IJ SOLN
INTRAMUSCULAR | Status: AC
  Filled 2016-10-21: qty 30

## 2016-10-21 MED ORDER — POTASSIUM CHLORIDE IN NACL 20-0.9 MEQ/L-% IV SOLN: INTRAVENOUS | Status: DC

## 2016-10-21 MED ORDER — PROPOFOL 10 MG/ML IV BOLUS
INTRAVENOUS | Status: DC | PRN
  Administered 2016-10-21: 200 mg via INTRAVENOUS

## 2016-10-21 MED ORDER — FENTANYL CITRATE (PF) 100 MCG/2ML IJ SOLN
INTRAMUSCULAR | Status: AC
  Filled 2016-10-21: qty 2

## 2016-10-21 MED ORDER — ROCURONIUM BROMIDE 50 MG/5ML IV SOLN
INTRAVENOUS | Status: AC
  Filled 2016-10-21: qty 1

## 2016-10-21 MED ORDER — FENTANYL CITRATE (PF) 100 MCG/2ML IJ SOLN
INTRAMUSCULAR | Status: AC
  Administered 2016-10-21: 50 ug
  Filled 2016-10-21: qty 2

## 2016-10-21 MED ORDER — ONDANSETRON HCL 4 MG/2ML IJ SOLN
INTRAMUSCULAR | Status: DC | PRN
  Administered 2016-10-21: 4 mg via INTRAVENOUS

## 2016-10-21 MED ORDER — ROCURONIUM BROMIDE 100 MG/10ML IV SOLN
INTRAVENOUS | Status: DC | PRN
  Administered 2016-10-21 (×2): 10 mg via INTRAVENOUS
  Administered 2016-10-21: 40 mg via INTRAVENOUS

## 2016-10-21 MED ORDER — OXYCODONE HCL 5 MG PO TABS: 5.0000 mg | ORAL_TABLET | ORAL | 0 refills | Status: AC | PRN

## 2016-10-21 MED ORDER — SUCCINYLCHOLINE CHLORIDE 20 MG/ML IJ SOLN
INTRAMUSCULAR | Status: DC | PRN
  Administered 2016-10-21: 120 mg via INTRAVENOUS

## 2016-10-21 MED ORDER — LACTATED RINGERS IV SOLN
INTRAVENOUS | Status: DC | PRN
  Administered 2016-10-21: 2 mL

## 2016-10-21 MED ORDER — LIDOCAINE HCL (PF) 1 % IJ SOLN
INTRAMUSCULAR | Status: DC | PRN
  Administered 2016-10-21: 5 mL via SUBCUTANEOUS

## 2016-10-21 MED ORDER — SEVOFLURANE IN SOLN
RESPIRATORY_TRACT | Status: AC
  Filled 2016-10-21: qty 250

## 2016-10-21 MED ORDER — LIDOCAINE HCL (CARDIAC) 20 MG/ML IV SOLN
INTRAVENOUS | Status: DC | PRN
  Administered 2016-10-21: 60 mg via INTRAVENOUS

## 2016-10-21 MED ORDER — SUGAMMADEX SODIUM 200 MG/2ML IV SOLN
INTRAVENOUS | Status: DC | PRN
  Administered 2016-10-21: 200 mg via INTRAVENOUS

## 2016-10-21 MED ORDER — FAMOTIDINE 20 MG PO TABS
20.0000 mg | ORAL_TABLET | Freq: Once | ORAL | Status: AC
  Administered 2016-10-21: 20 mg via ORAL

## 2016-10-21 MED ORDER — MIDAZOLAM HCL 2 MG/2ML IJ SOLN
INTRAMUSCULAR | Status: DC | PRN
  Administered 2016-10-21: 1 mg via INTRAVENOUS

## 2016-10-21 MED ORDER — FENTANYL CITRATE (PF) 100 MCG/2ML IJ SOLN: 50.0000 ug | Freq: Once | INTRAMUSCULAR | Status: DC

## 2016-10-21 MED ORDER — METOCLOPRAMIDE HCL 10 MG PO TABS: 5.0000 mg | ORAL_TABLET | Freq: Three times a day (TID) | ORAL | Status: DC | PRN

## 2016-10-21 MED ORDER — LIDOCAINE HCL (PF) 2 % IJ SOLN
INTRAMUSCULAR | Status: AC
  Filled 2016-10-21: qty 2

## 2016-10-21 MED ORDER — MIDAZOLAM HCL 2 MG/2ML IJ SOLN
INTRAMUSCULAR | Status: AC
  Filled 2016-10-21: qty 2

## 2016-10-21 MED ORDER — DEXAMETHASONE SODIUM PHOSPHATE 10 MG/ML IJ SOLN
INTRAMUSCULAR | Status: AC
  Filled 2016-10-21: qty 1

## 2016-10-21 MED ORDER — BUPIVACAINE-EPINEPHRINE 0.5% -1:200000 IJ SOLN
INTRAMUSCULAR | Status: DC | PRN
  Administered 2016-10-21: 30 mL

## 2016-10-21 MED ORDER — ONDANSETRON HCL 4 MG PO TABS: 4.0000 mg | ORAL_TABLET | Freq: Four times a day (QID) | ORAL | Status: DC | PRN

## 2016-10-21 MED ORDER — ONDANSETRON HCL 4 MG/2ML IJ SOLN: 4.0000 mg | Freq: Four times a day (QID) | INTRAMUSCULAR | Status: DC | PRN

## 2016-10-21 MED ORDER — LACTATED RINGERS IV SOLN
INTRAVENOUS | Status: DC
  Administered 2016-10-21: 08:00:00 via INTRAVENOUS

## 2016-10-21 SURGICAL SUPPLY — 46 items
ANCHOR JUGGERKNOT WTAP NDL 2.9 (Anchor) ×18 IMPLANT
ANCHOR SUT QUATTRO KNTLS 4.5 (Anchor) ×9 IMPLANT
BIT DRILL JUGRKNT W/NDL BIT2.9 (DRILL) ×1 IMPLANT
BLADE FULL RADIUS 3.5 (BLADE) ×3 IMPLANT
BUR ACROMIONIZER 4.0 (BURR) ×3 IMPLANT
CANNULA SHAVER 8MMX76MM (CANNULA) ×3 IMPLANT
CHLORAPREP W/TINT 26ML (MISCELLANEOUS) ×3 IMPLANT
COVER MAYO STAND STRL (DRAPES) ×3 IMPLANT
DRAPE IMP U-DRAPE 54X76 (DRAPES) ×6 IMPLANT
DRILL JUGGERKNOT W/NDL BIT 2.9 (DRILL) ×3
DRSG OPSITE POSTOP 4X8 (GAUZE/BANDAGES/DRESSINGS) ×3 IMPLANT
ELECT REM PT RETURN 9FT ADLT (ELECTROSURGICAL) ×3
ELECTRODE REM PT RTRN 9FT ADLT (ELECTROSURGICAL) ×1 IMPLANT
GAUZE PETRO XEROFOAM 1X8 (MISCELLANEOUS) ×3 IMPLANT
GAUZE SPONGE 4X4 12PLY STRL (GAUZE/BANDAGES/DRESSINGS) ×3 IMPLANT
GLOVE BIO SURGEON STRL SZ7.5 (GLOVE) ×6 IMPLANT
GLOVE BIO SURGEON STRL SZ8 (GLOVE) ×6 IMPLANT
GLOVE BIOGEL PI IND STRL 8 (GLOVE) ×1 IMPLANT
GLOVE BIOGEL PI INDICATOR 8 (GLOVE) ×2
GLOVE INDICATOR 8.0 STRL GRN (GLOVE) ×3 IMPLANT
GOWN STRL REUS W/ TWL LRG LVL3 (GOWN DISPOSABLE) ×1 IMPLANT
GOWN STRL REUS W/ TWL XL LVL3 (GOWN DISPOSABLE) ×1 IMPLANT
GOWN STRL REUS W/TWL LRG LVL3 (GOWN DISPOSABLE) ×2
GOWN STRL REUS W/TWL XL LVL3 (GOWN DISPOSABLE) ×2
GRASPER SUT 15 45D LOW PRO (SUTURE) IMPLANT
IV LACTATED RINGER IRRG 3000ML (IV SOLUTION) ×4
IV LR IRRIG 3000ML ARTHROMATIC (IV SOLUTION) ×2 IMPLANT
MANIFOLD NEPTUNE II (INSTRUMENTS) ×3 IMPLANT
MASK FACE SPIDER DISP (MASK) ×3 IMPLANT
MAT BLUE FLOOR 46X72 FLO (MISCELLANEOUS) ×3 IMPLANT
NDL MAYO CATGUT SZ5 (NEEDLE)
NDL SUT 5 .5 CRC TPR PNT MAYO (NEEDLE) IMPLANT
NEEDLE REVERSE CUT 1/2 CRC (NEEDLE) IMPLANT
PACK ARTHROSCOPY SHOULDER (MISCELLANEOUS) ×3 IMPLANT
SLING ARM LRG DEEP (SOFTGOODS) IMPLANT
SLING ULTRA II LG (MISCELLANEOUS) ×3 IMPLANT
STAPLER SKIN PROX 35W (STAPLE) ×3 IMPLANT
STRAP SAFETY BODY (MISCELLANEOUS) ×3 IMPLANT
SUT ETHIBOND 0 MO6 C/R (SUTURE) ×3 IMPLANT
SUT VIC AB 2-0 CT1 27 (SUTURE) ×4
SUT VIC AB 2-0 CT1 TAPERPNT 27 (SUTURE) ×2 IMPLANT
TAPE MICROFOAM 4IN (TAPE) ×3 IMPLANT
TUBING ARTHRO INFLOW-ONLY STRL (TUBING) ×3 IMPLANT
TUBING CONNECTING 10 (TUBING) ×2 IMPLANT
TUBING CONNECTING 10' (TUBING) ×1
WAND HAND CNTRL MULTIVAC 90 (MISCELLANEOUS) ×3 IMPLANT

## 2016-10-21 NOTE — Anesthesia Procedure Notes (Signed)
Procedure Name: Intubation Date/Time: 10/21/2016 8:57 AM Performed by: Karoline CaldwellSTARR, Annaston Upham Pre-anesthesia Checklist: Patient identified, Patient being monitored, Timeout performed, Emergency Drugs available and Suction available Patient Re-evaluated:Patient Re-evaluated prior to induction Oxygen Delivery Method: Circle system utilized Preoxygenation: Pre-oxygenation with 100% oxygen Induction Type: IV induction, Rapid sequence and Cricoid Pressure applied Laryngoscope Size: McGraph and 4 Grade View: Grade I Tube type: Oral Tube size: 7.5 mm Number of attempts: 1 Airway Equipment and Method: Stylet Placement Confirmation: ETT inserted through vocal cords under direct vision,  positive ETCO2 and breath sounds checked- equal and bilateral Secured at: 22 cm Tube secured with: Tape Dental Injury: Teeth and Oropharynx as per pre-operative assessment  Difficulty Due To: Difficulty was anticipated and Difficult Airway- due to limited oral opening Comments: RSI d/t preop nausea

## 2016-10-21 NOTE — Anesthesia Procedure Notes (Signed)
Anesthesia Regional Block: Interscalene brachial plexus block   Pre-Anesthetic Checklist: ,, timeout performed, Correct Patient, Correct Site, Correct Laterality, Correct Procedure, Correct Position, site marked, Risks and benefits discussed,  Surgical consent,  Pre-op evaluation,  At surgeon's request and post-op pain management  Laterality: Right and Upper  Prep: chloraprep       Needles:  Injection technique: Single-shot  Needle Type: Stimiplex     Needle Length: 5cm  Needle Gauge: 22     Additional Needles:   Procedures: ultrasound guided,,,,,,,,  Narrative:  Start time: 10/21/2016 8:34 AM End time: 10/21/2016 8:40 AM Injection made incrementally with aspirations every 5 mL.  Performed by: Personally  Anesthesiologist: Lenard SimmerKARENZ, Lerin Jech  Additional Notes: Functioning IV was confirmed and monitors were applied.  A 50mm 22ga Stimuplex needle was used. Sterile prep and drape,hand hygiene and sterile gloves were used.  Negative aspiration and negative test dose prior to incremental administration of local anesthetic. The patient tolerated the procedure well.

## 2016-10-21 NOTE — Progress Notes (Signed)
Patient able to move right hand and fingers, color wnl, Warm to touch and able to feel touch against His skin.

## 2016-10-21 NOTE — Anesthesia Postprocedure Evaluation (Signed)
Anesthesia Post Note  Patient: Wayne HawthorneJames [name]  Procedure(s) Performed: Procedure(s) (LRB): SHOULDER ARTHROSCOPY WITH OPEN ROTATOR CUFF REPAIR (Right) SHOULDER ARTHROSCOPY WITH DEBRIDEMENT AND BICEP TENDON REPAIR (Right) SHOULDER ARTHROSCOPY WITH SUBACROMIAL DECOMPRESSION (Right)  Patient location during evaluation: PACU Anesthesia Type: General and Regional Level of consciousness: awake and alert Pain management: pain level controlled Vital Signs Assessment: post-procedure vital signs reviewed and stable Respiratory status: spontaneous breathing, nonlabored ventilation, respiratory function stable and patient connected to nasal cannula oxygen Cardiovascular status: blood pressure returned to baseline and stable Postop Assessment: no signs of nausea or vomiting Anesthetic complications: no     Last Vitals:  Vitals:   10/21/16 1302 10/21/16 1330  BP: 124/71 101/82  Pulse: (!) 57 64  Resp:  20  Temp: (!) 36.3 C 36.5 C  SpO2: 98% 100%    Last Pain:  Vitals:   10/21/16 1302  TempSrc: Temporal  PainSc:                  Lenard SimmerAndrew Jovani Colquhoun

## 2016-10-21 NOTE — Discharge Instructions (Addendum)
Keep dressing dry and intact.  May shower after dressing changed on post-op day #4 (Saturday).  Cover staples with Band-Aids after drying off. Apply ice frequently to shoulder. Take ibuprofen 800 mg TID with meals for 7-10 days, then as necessary. Take oxycodone as prescribed when needed.  May supplement with ES Tylenol if necessary. Keep shoulder immobilizer on at all times except may remove for bathing purposes. Follow-up in 10-14 days or as scheduled.  AMBULATORY SURGERY  DISCHARGE INSTRUCTIONS   1) The drugs that you were given will stay in your system until tomorrow so for the next 24 hours you should not:  A) Drive an automobile B) Make any legal decisions C) Drink any alcoholic beverage   2) You may resume regular meals tomorrow.  Today it is better to start with liquids and gradually work up to solid foods.  You may eat anything you prefer, but it is better to start with liquids, then soup and crackers, and gradually work up to solid foods.   3) Please notify your doctor immediately if you have any unusual bleeding, trouble breathing, redness and pain at the surgery site, drainage, fever, or pain not relieved by medication.    4) Additional Instructions: TAKE A STOOL SOFTENER TWICE A DAY WHILE TAKING NARCOTIC PAIN MEDICINE TO PREVENT CONSTIPATION   Please contact your physician with any problems or Same Day Surgery at [phone], Monday through Friday 6 am to 4 pm, or [place] at [facility] Main number at [phone].

## 2016-10-21 NOTE — H&P (Signed)
Paper H&P to be scanned into permanent record. H&P reviewed and patient re-examined. No changes. 

## 2016-10-21 NOTE — Anesthesia Post-op Follow-up Note (Signed)
Anesthesia QCDR form completed.        

## 2016-10-21 NOTE — Anesthesia Preprocedure Evaluation (Signed)
Anesthesia Evaluation  Patient identified by MRN, date of birth, ID band Patient awake    Reviewed: Allergy & Precautions, H&P , NPO status , Patient's Chart, lab work & pertinent test results, reviewed documented beta blocker date and time   Airway Mallampati: III  TM Distance: >3 FB Neck ROM: full  Mouth opening: Limited Mouth Opening  Dental  (+) Dental Advidsory Given, Teeth Intact   Pulmonary neg shortness of breath, asthma (as a child) , neg sleep apnea, neg COPD, neg recent URI, Current Smoker,           Cardiovascular Exercise Tolerance: Good negative cardio ROS       Neuro/Psych PSYCHIATRIC DISORDERS negative neurological ROS     GI/Hepatic Neg liver ROS, GERD  ,  Endo/Other  negative endocrine ROS  Renal/GU negative Renal ROS  negative genitourinary   Musculoskeletal   Abdominal   Peds  Hematology negative hematology ROS (+)   Anesthesia Other Findings Past Medical History: No date: Anxiety No date: Arthritis     Comment:  RIGHT SHOULDER AND HAND No date: Asthma     Comment:  AS A CHILD No date: Boil     Comment:  LANCED No date: GERD (gastroesophageal reflux disease)     Comment:  FREQUENTLY- TUMS PRN   Reproductive/Obstetrics negative OB ROS                             Anesthesia Physical Anesthesia Plan  ASA: II  Anesthesia Plan: General   Post-op Pain Management:    Induction: Intravenous  PONV Risk Score and Plan: 1 and Ondansetron and Dexamethasone  Airway Management Planned: Oral ETT  Additional Equipment:   Intra-op Plan:   Post-operative Plan: Extubation in OR  Informed Consent: I have reviewed the patients History and Physical, chart, labs and discussed the procedure including the risks, benefits and alternatives for the proposed anesthesia with the patient or authorized representative who has indicated his/her understanding and acceptance.    Dental Advisory Given  Plan Discussed with: Anesthesiologist, CRNA and Surgeon  Anesthesia Plan Comments:         Anesthesia Quick Evaluation

## 2016-10-21 NOTE — Op Note (Signed)
10/21/2016  11:41 AM  Patient:   Wayne [name]  Pre-Op Diagnosis:   Impingement/tendinopathy with large rotator cuff tear, right shoulder.  Post-Op Diagnosis: Impingement/tendinopathy with massive rotator cuff tear and biceps tendinopathy, right shoulder.  Procedure: Limited arthroscopic debridement, arthroscopic subacromial decompression, mini-open rotator cuff repair, and mini-open biceps tenodesis, right shoulder.  Anesthesia: General endotracheal with interscalene block placed preoperatively by the anesthesiologist.  Surgeon:   Maryagnes Amos, MD  Assistant:   Horris Latino, PA-C; Theodoro Kos, PA-S  Findings: As above. The labrum demonstrated mild fraying but otherwise was intact. The biceps tendon demonstrated significant tendinopathy changes with partial tearing. There was an incomplete tear involving the articular surface of the superior half of the subscapularis tendon. Both the supraspinatus and supraspinatus tendons were torn and retracted. The articular surfaces of the glenoid and humerus both were in satisfactory condition.  Complications: None  Fluids:   800 cc  Estimated blood loss: 15 cc  Tourniquet time: None  Drains: None  Closure: Staples   Brief clinical note: The patient is a 51 year old male with a history of right shoulder pain and weakness. The patient's symptoms have progressed despite medications, activity modification, etc. The patient's history and examination are consistent with impingement/tendinopathy with a large rotator cuff tear. These findings were confirmed by MRI scan. The patient presents at this time for definitive management of these shoulder symptoms.  Procedure: The patient underwent placement of an interscalene block by the anesthesiologist in the preoperative holding area before being brought into the operating room and lain in the supine position. The patient then underwent general endotracheal intubation and  anesthesia before being repositioned in the beach chair position using the beach chair positioner. The right shoulder and upper extremity were prepped with ChloraPrep solution before being draped sterilely. Preoperative antibiotics were administered. A timeout was performed to confirm the proper surgical site before the expected portal sites and incision site were injected with 0.5% Sensorcaine with epinephrine. A posterior portal was created and the glenohumeral joint thoroughly inspected with the findings as described above. An anterior portal was created using an outside-in technique. The labrum and rotator cuff were further probed, again confirming the above-noted findings. The areas of labral fraying and synovitis were debrided back to stable margins using the full-radius resector. The ArthroCare wand was inserted and used to release the biceps tendon from its labral anchor. In addition, it was used to obtain hemostasis as well as to "anneal" the labrum superiorly and anteriorly. The instruments were removed from the joint after suctioning the excess fluid.  The camera was repositioned through the posterior portal into the subacromial space. A separate lateral portal was created using an outside-in technique. The 3.5 mm full-radius resector was introduced and used to perform a subtotal bursectomy. The ArthroCare wand was then inserted and used to remove the periosteal tissue off the undersurface of the anterior third of the acromion as well as to recess the coracoacromial ligament from its attachment along the anterior and lateral margins of the acromion. The 4.0 mm acromionizing bur was introduced and used to complete the decompression by removing the undersurface of the anterior third of the acromion. The full radius resector was reintroduced to remove any residual bony debris before the ArthroCare wand was reintroduced to obtain hemostasis. The instruments were then removed from the subacromial space after  suctioning the excess fluid.  An approximately 4-5 cm incision was made over the anterolateral aspect of the shoulder beginning at the anterolateral corner of the  acromion and extending distally in line with the bicipital groove. This incision was carried down through the subcutaneous tissues to expose the deltoid fascia. The raphae between the anterior and middle thirds was identified and this plane developed to provide access into the subacromial space. Additional bursal tissues were debrided sharply using Metzenbaum scissors. The rotator cuff tear was readily identified. Extensive dissection was required to mobilize the anterior and posterior flaps sufficiently to enable the rotator cuff to be reapproximated appropriately.   Once the tear was assessed, it was elected to proceed with performing a biceps tenodesis first. The bicipital groove was identified by palpation and opened for 1-1.5 cm. The biceps tendon stump was retrieved through this defect. The floor of the bicipital groove was roughened with a curet before a single Biomet 2.9 mm JuggerKnot anchor was inserted. Both sets of sutures were passed through the biceps tendon and tied securely to effect the tenodesis. The bicipital sheath was reapproximated using two #0 Ethibond interrupted sutures, incorporating the biceps tendon to further reinforce the tenodesis.  Next, the subscapularis tendon was repaired using two additional Biomet 2.9 mm JuggerKnot anchors. The sutures were brought back laterally and secured using a third [employer] QuatroLink anchor to create a two-layer closure.   Finally, the tear involving the supraspinatus and infraspinatus tendons was addressed. The margins were debrided sharply with a #15 blade and the exposed greater tuberosity roughened with a rongeur. This portion of the tear was repaired using three Biomet 2.9 mm JuggerKnot anchors. These sutures were then brought back laterally and secured using two [place] QuatroLink  anchors to create a two-layer closure. Several #0 Ethibond interrupted sutures used to reapproximate the longitudinal portion of the tear. An apparent watertight closure was obtained.  The wound was copiously irrigated with sterile saline solution before the deltoid raphae was reapproximated using 2-0 Vicryl interrupted sutures. The subcutaneous tissues were closed in two layers using 2-0 Vicryl interrupted sutures before the skin was closed using staples. The portal sites also were closed using staples. A sterile bulky dressing was applied to the shoulder before the arm was placed into a shoulder immobilizer. The patient was then awakened, extubated, and returned to the recovery room in satisfactory condition after tolerating the procedure well.

## 2016-10-21 NOTE — Transfer of Care (Signed)
Immediate Anesthesia Transfer of Care Note  Patient: Mikeal HawthorneJames Villarin  Procedure(s) Performed: Procedure(s) with comments: SHOULDER ARTHROSCOPY WITH OPEN ROTATOR CUFF REPAIR (Right) - Massive cuff tear SHOULDER ARTHROSCOPY WITH DEBRIDEMENT AND BICEP TENDON REPAIR (Right) SHOULDER ARTHROSCOPY WITH SUBACROMIAL DECOMPRESSION (Right)  Patient Location: PACU  Anesthesia Type:General  Level of Consciousness: sedated  Airway & Oxygen Therapy: Patient Spontanous Breathing and Patient connected to face mask oxygen  Post-op Assessment: Report given to RN and Post -op Vital signs reviewed and stable  Post vital signs: Reviewed and stable  Last Vitals:  Vitals:   10/21/16 0845 10/21/16 1158  BP: 124/79 119/83  Pulse: 66 65  Resp: 17 12  Temp:  (!) 36.1 C  SpO2: 99% 98%    Complications: No apparent anesthesia complications

## 2016-10-21 NOTE — OR Nursing (Signed)
Bear hugger applied to patient c/o being cold and needed to be warm or he would get sick

## 2016-10-21 NOTE — H&P (Signed)
Subjective:  Chief complaint:  Right shoulder pain.  The patient is a 51 y.o. male who presents today for a right shoulder arthroscopy with debridement, decompression, repair of his rotator cuff tear and possible biceps tenodesis.  Surgery is scheduled with Dr. Joice LoftsPoggi for today.  Pt reports a several month history of right shoulder pain due to repetitive motion at work.  The patient denies any associated injury or loss of consciousness associated with the injury, and denies any light-headedness, loss of consciousness, chest pain, or shortness of breath which might have contributed to the injury.  Patient Active Problem List   Diagnosis Date Noted  . Depression 01/15/2015  . MDD (major depressive disorder), single episode, severe (HCC) 01/15/2015  . Adjustment disorder with mixed anxiety and depressed mood   . Substance induced mood disorder (HCC)    Past Medical History:  Diagnosis Date  . Anxiety   . Arthritis    RIGHT SHOULDER AND HAND  . Asthma    AS A CHILD  . Boil    LANCED  . GERD (gastroesophageal reflux disease)    FREQUENTLY- TUMS PRN    Past Surgical History:  Procedure Laterality Date  . NO PAST SURGERIES      Prescriptions Prior to Admission  Medication Sig Dispense Refill Last Dose  . calcium carbonate (TUMS - DOSED IN MG ELEMENTAL CALCIUM) 500 MG chewable tablet Chew 1 tablet by mouth as needed for indigestion or heartburn.   "been a while"  . ibuprofen (ADVIL,MOTRIN) 200 MG tablet Take 400-600 mg by mouth every 6 (six) hours as needed for moderate pain (depends on pain if takes 2-3 tablets).    "over two weeks"  . hydrOXYzine (ATARAX/VISTARIL) 25 MG tablet Take 1 tablet (25 mg total) by mouth every 6 (six) hours as needed for anxiety. (Patient not taking: Reported on 09/05/2016) 30 tablet 0 Not Taking at "didn't even know I had that kind of medication"  . meloxicam (MOBIC) 15 MG tablet Take 15 mg by mouth daily.   "half a pill 3 weeks ago"  . mirtazapine (REMERON) 15  MG tablet Take 1 tablet (15 mg total) by mouth at bedtime. (Patient not taking: Reported on 09/05/2016) 30 tablet 0 "never heard of it"   Allergies  Allergen Reactions  . Iodine Anaphylaxis    Swelling and rash  . Albuterol Other (See Comments)    PT STATES INHALER MAKES HIM DIZZY AND PASS OUT  . Coconut Flavor Rash    rash  . Shellfish Allergy Swelling and Rash    Social History  Substance Use Topics  . Smoking status: Current Every Day Smoker    Packs/day: 1.00    Years: 35.00    Types: Cigarettes  . Smokeless tobacco: Never Used  . Alcohol use Yes    History reviewed. No pertinent family history.   Review of Systems: As noted above. The patient denies any chest pain, shortness of breath, nausea, vomiting, diarrhea, constipation, belly pain, blood in his/her stool, or burning with urination.  Objective: Temp:  [97.2 F (36.2 C)] 97.2 F (36.2 C) (08/14 0707) Pulse Rate:  [80] 80 (08/14 0707) Resp:  [16] 16 (08/14 0707) BP: (140)/(90) 140/90 (08/14 0707) SpO2:  [99 %] 99 % (08/14 0707) Weight:  [95.3 kg (210 lb)] 95.3 kg (210 lb) (08/14 0707)  Physical Exam: General:  Alert, no acute distress Psychiatric:  Patient is competent for consent with normal mood and affect Cardiovascular:  RRR  Respiratory:  Clear to auscultation. No  wheezing. Non-labored breathing GI:  Abdomen is soft and non-tender Skin:  No lesions in the area of chief complaint Neurologic:  Sensation intact distally Lymphatic:  No axillary or cervical lymphadenopathy  Orthopedic Exam:  Examination of the right shoulder and arm showed no bony abnormality or edema.  The patient has pain when reaching 100 degrees forward flexion, 95 degrees abduction, full internal rotation and can externally rotate to 25 degrees of mild pain.  Positive Hawkins, positive impingement test.  Negative drop arm test.  Tender along the deltoid muscle.  No subacromial space tenderness, no AC joint tenderness.  Negative sulcus signs.   Full strength to the right upper extremity.  Intact to light touch, 2 second cap refill.  Normal pulses  Imaging Review: Mri of the right shoulder demonstrated a complete supraspinatus and near complete infraspinatus tendon tear with 3-4 cm of supraspinatus retraction and 4-5 cm of infraspinatus retraction, without muscle atrophy.  Moderate AC joint osteoarthritis with subacromial spurring noted.    Assessment: Complete tear of right rotator cuff.  Plan: 1.  Treatment options were discussed with the patient. 2.  Pt was instructed on the risks and benefits of surgery and wishes to proceed with a right shoulder arthroscopy with debridement, decompression, repair of rotator cuff and possible biceps tenodesis.   3.  This document will serve and the surgical history and physical for the patient. 4.  He will follow-up per standard post-op protocol.  The procedure was discussed with the patient, as were the potential risks (influding bleeding, infection, nerve and/or blood vessel injury, persistent or recurrent pain, failure of the repair, progression of arthritis, need for further surgery, blood clots, strokes, heart attacks and/or arrhythmias, pneumonias, etc.) and benefits.  The patient states his understanding and wishes to proceed.  Valeria Batman, PA-C [facility]

## 2016-11-12 NOTE — Addendum Note (Signed)
Addendum  created 11/12/16 1115 by Stormy Fabianurtis, Zoanne Newill, CRNA   Charge Capture section accepted

## 2017-06-22 ENCOUNTER — Encounter: Payer: Self-pay | Admitting: Emergency Medicine

## 2017-06-22 DIAGNOSIS — W503XXA Accidental bite by another person, initial encounter: Secondary | ICD-10-CM | POA: Insufficient documentation

## 2017-06-22 DIAGNOSIS — S022XXA Fracture of nasal bones, initial encounter for closed fracture: Secondary | ICD-10-CM | POA: Insufficient documentation

## 2017-06-22 DIAGNOSIS — Z23 Encounter for immunization: Secondary | ICD-10-CM | POA: Insufficient documentation

## 2017-06-22 DIAGNOSIS — Z79899 Other long term (current) drug therapy: Secondary | ICD-10-CM | POA: Insufficient documentation

## 2017-06-22 DIAGNOSIS — Y999 Unspecified external cause status: Secondary | ICD-10-CM | POA: Insufficient documentation

## 2017-06-22 DIAGNOSIS — Y929 Unspecified place or not applicable: Secondary | ICD-10-CM | POA: Insufficient documentation

## 2017-06-22 DIAGNOSIS — Y939 Activity, unspecified: Secondary | ICD-10-CM | POA: Insufficient documentation

## 2017-06-22 DIAGNOSIS — S0083XA Contusion of other part of head, initial encounter: Secondary | ICD-10-CM | POA: Insufficient documentation

## 2017-06-22 DIAGNOSIS — S60371A Other superficial bite of right thumb, initial encounter: Secondary | ICD-10-CM | POA: Insufficient documentation

## 2017-06-22 DIAGNOSIS — H1131 Conjunctival hemorrhage, right eye: Secondary | ICD-10-CM | POA: Insufficient documentation

## 2017-06-22 DIAGNOSIS — T07XXXA Unspecified multiple injuries, initial encounter: Secondary | ICD-10-CM | POA: Insufficient documentation

## 2017-06-22 DIAGNOSIS — H05231 Hemorrhage of right orbit: Secondary | ICD-10-CM | POA: Insufficient documentation

## 2017-06-22 DIAGNOSIS — F17210 Nicotine dependence, cigarettes, uncomplicated: Secondary | ICD-10-CM | POA: Insufficient documentation
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
## 2017-06-23 ENCOUNTER — Emergency Department
Admission: EM | Admit: 2017-06-23 | Discharge: 2017-06-23 | Disposition: A | Discharge status: Home / Self Care | Payer: Self-pay | Attending: Emergency Medicine | Admitting: Emergency Medicine

## 2017-06-23 ENCOUNTER — Emergency Department: Payer: Self-pay

## 2017-06-23 DIAGNOSIS — H05231 Hemorrhage of right orbit: Secondary | ICD-10-CM

## 2017-06-23 DIAGNOSIS — H1131 Conjunctival hemorrhage, right eye: Secondary | ICD-10-CM

## 2017-06-23 DIAGNOSIS — S0083XA Contusion of other part of head, initial encounter: Secondary | ICD-10-CM

## 2017-06-23 DIAGNOSIS — T07XXXA Unspecified multiple injuries, initial encounter: Secondary | ICD-10-CM

## 2017-06-23 DIAGNOSIS — W503XXA Accidental bite by another person, initial encounter: Secondary | ICD-10-CM

## 2017-06-23 DIAGNOSIS — S022XXA Fracture of nasal bones, initial encounter for closed fracture: Secondary | ICD-10-CM

## 2017-06-23 MED ORDER — IBUPROFEN 800 MG PO TABS: 800.0000 mg | ORAL_TABLET | Freq: Three times a day (TID) | ORAL | 0 refills | Status: AC | PRN

## 2017-06-23 MED ORDER — AMOXICILLIN-POT CLAVULANATE 875-125 MG PO TABS
1.0000 | ORAL_TABLET | Freq: Once | ORAL | Status: AC
  Administered 2017-06-23: 1 via ORAL
  Filled 2017-06-23: qty 1

## 2017-06-23 MED ORDER — AMOXICILLIN-POT CLAVULANATE 875-125 MG PO TABS: 1.0000 | ORAL_TABLET | Freq: Two times a day (BID) | ORAL | 0 refills | Status: AC

## 2017-06-23 MED ORDER — TETANUS-DIPHTH-ACELL PERTUSSIS 5-2.5-18.5 LF-MCG/0.5 IM SUSP
0.5000 mL | Freq: Once | INTRAMUSCULAR | Status: AC
  Administered 2017-06-23: 0.5 mL via INTRAMUSCULAR
  Filled 2017-06-23: qty 0.5

## 2017-06-23 MED ORDER — IBUPROFEN 800 MG PO TABS
800.0000 mg | ORAL_TABLET | Freq: Once | ORAL | Status: AC
  Administered 2017-06-23: 800 mg via ORAL
  Filled 2017-06-23: qty 1

## 2017-06-23 MED ORDER — OXYCODONE-ACETAMINOPHEN 5-325 MG PO TABS: 1.0000 | ORAL_TABLET | ORAL | 0 refills | Status: AC | PRN

## 2017-06-23 NOTE — Discharge Instructions (Addendum)
1.  Take antibiotic as prescribed (Augmentin 875 mg twice daily for 7 days). 2.  Apply ice to affected area several times daily to reduce swelling. 3.  You may take pain medicines as needed (Motrin/Percocet #15). 4.  Return to the ER for worsening symptoms, persistent vomiting, difficulty breathing, lethargy, increased redness/swelling to your thumb, or other concerns.
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
